# Patient Record
Sex: Female | Born: 1990 | Race: White | Hispanic: No | Marital: Single | Smoking: Former smoker
Health system: Southern US, Community
[De-identification: ages and names within clinical notes are randomized; demographics above are authoritative.]

## PROBLEM LIST (undated history)

## (undated) HISTORY — PX: FOOT SURGERY: SHX648

---

## 2016-04-21 ENCOUNTER — Emergency Department: Payer: Self-pay

## 2016-04-21 ENCOUNTER — Encounter: Payer: Self-pay | Admitting: Emergency Medicine

## 2016-04-21 ENCOUNTER — Emergency Department
Admission: EM | Admit: 2016-04-21 | Discharge: 2016-04-21 | Disposition: A | Discharge status: Home / Self Care | Payer: Self-pay | Attending: Student | Admitting: Student

## 2016-04-21 DIAGNOSIS — R112 Nausea with vomiting, unspecified: Secondary | ICD-10-CM

## 2016-04-21 DIAGNOSIS — R109 Unspecified abdominal pain: Secondary | ICD-10-CM

## 2016-04-21 DIAGNOSIS — Z79899 Other long term (current) drug therapy: Secondary | ICD-10-CM | POA: Insufficient documentation

## 2016-04-21 DIAGNOSIS — R197 Diarrhea, unspecified: Secondary | ICD-10-CM | POA: Insufficient documentation

## 2016-04-21 DIAGNOSIS — O219 Vomiting of pregnancy, unspecified: Secondary | ICD-10-CM | POA: Insufficient documentation

## 2016-04-21 DIAGNOSIS — Z87891 Personal history of nicotine dependence: Secondary | ICD-10-CM | POA: Insufficient documentation

## 2016-04-21 DIAGNOSIS — Z3A01 Less than 8 weeks gestation of pregnancy: Secondary | ICD-10-CM | POA: Insufficient documentation

## 2016-04-21 DIAGNOSIS — O26891 Other specified pregnancy related conditions, first trimester: Secondary | ICD-10-CM | POA: Insufficient documentation

## 2016-04-21 DIAGNOSIS — O26899 Other specified pregnancy related conditions, unspecified trimester: Secondary | ICD-10-CM

## 2016-04-21 DIAGNOSIS — O30019 Twin pregnancy, monochorionic/monoamniotic, unspecified trimester: Secondary | ICD-10-CM

## 2016-04-21 DIAGNOSIS — R1010 Upper abdominal pain, unspecified: Secondary | ICD-10-CM

## 2016-04-21 LAB — CBC
HCT: 38.9 % (ref 35.0–47.0)
HEMATOCRIT: 39.6 % (ref 35.0–47.0)
HEMOGLOBIN: 13.7 g/dL (ref 12.0–16.0)
Hemoglobin: 13.9 g/dL (ref 12.0–16.0)
MCH: 31.2 pg (ref 26.0–34.0)
MCH: 31.7 pg (ref 26.0–34.0)
MCHC: 35.1 g/dL (ref 32.0–36.0)
MCHC: 35 g/dL (ref 32.0–36.0)
MCV: 88.7 fL (ref 80.0–100.0)
MCV: 90.5 fL (ref 80.0–100.0)
PLATELETS: 304 10*3/uL (ref 150–440)
PLATELETS: 283 10*3/uL (ref 150–440)
RBC: 4.38 MIL/uL (ref 3.80–5.20)
RBC: 4.38 MIL/uL (ref 3.80–5.20)
RDW: 12.7 % (ref 11.5–14.5)
RDW: 12.8 % (ref 11.5–14.5)
WBC: 9.6 10*3/uL (ref 3.6–11.0)
WBC: 10.1 10*3/uL (ref 3.6–11.0)

## 2016-04-21 LAB — COMPREHENSIVE METABOLIC PANEL
ALT: 25 U/L (ref 14–54)
ANION GAP: 7 (ref 5–15)
AST: 24 U/L (ref 15–41)
Albumin: 4.5 g/dL (ref 3.5–5.0)
Alkaline Phosphatase: 56 U/L (ref 38–126)
BUN: 7 mg/dL (ref 6–20)
CHLORIDE: 105 mmol/L (ref 101–111)
CO2: 23 mmol/L (ref 22–32)
Calcium: 9 mg/dL (ref 8.9–10.3)
Creatinine, Ser: 0.76 mg/dL (ref 0.44–1.00)
GFR calc non Af Amer: >60 mL/min (ref >60)
Glucose, Bld: 101 mg/dL — ABNORMAL HIGH (ref 65–99)
Potassium: 3.5 mmol/L (ref 3.5–5.1)
SODIUM: 135 mmol/L (ref 135–145)
Total Bilirubin: 0.6 mg/dL (ref 0.3–1.2)
Total Protein: 7.6 g/dL (ref 6.5–8.1)

## 2016-04-21 LAB — URINALYSIS COMPLETE WITH MICROSCOPIC (ARMC ONLY)
BILIRUBIN URINE: NEGATIVE
Glucose, UA: NEGATIVE mg/dL
KETONES UR: NEGATIVE mg/dL
LEUKOCYTES UA: NEGATIVE
Nitrite: NEGATIVE
PH: 7 (ref 5.0–8.0)
PROTEIN: NEGATIVE mg/dL
SPECIFIC GRAVITY, URINE: 1.002 — AB (ref 1.005–1.030)

## 2016-04-21 LAB — LIPASE, BLOOD: Lipase: 18 U/L (ref 11–51)

## 2016-04-21 LAB — HCG, QUANTITATIVE, PREGNANCY: hCG, Beta Chain, Quant, S: 67878 m[IU]/mL — ABNORMAL HIGH (ref <5)

## 2016-04-21 NOTE — ED Provider Notes (Addendum)
[facility] Emergency Department Provider Note   ____________________________________________   First MD Initiated Contact with Patient 04/21/16 [phone]     (approximate)  I have reviewed the triage vital signs and the nursing notes.   HISTORY  Chief Complaint Emesis and Diarrhea    HPI Tracy Jones is a 25 y.o. female G2 P1 at approximately 6 weeks estimated gestational age by last menstrual period who presents for evaluation of 3 days of upper abdominal pain, recurrent nonbloody nonbilious emesis, recurrent diarrhea, gradual onset, constant, moderate, no modifying factors. She denies fevers but she has had chills. She denies any lower abdominal/pelvic pain. No vaginal bleeding or abnormal vaginal discharge. She reports that twice when she had diarrhea she did note some blood mixed with the stool. No hematemesis, no dark or tarry stools. 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 She has not established care with an OB/GYN yet this pregnancy.   History reviewed. No pertinent past medical history.  There are no active problems to display for this patient.   Past Surgical History:  Procedure Laterality Date  . CESAREAN SECTION    . FOOT SURGERY      Prior to Admission medications   Medication Sig Start Date End Date Taking? Authorizing Provider  Prenatal Vit-Fe Fumarate-FA (PRENATAL MULTIVITAMIN) TABS tablet Take 1 tablet by mouth at bedtime.    Yes Historical Provider, MD    Allergies Codeine  History reviewed. No pertinent family history.  Social History Social History  Substance Use Topics  . Smoking status: Former Games developer  . Smokeless tobacco: Never Used  . Alcohol use No    Review of Systems Constitutional: No fever, +chills Eyes: No visual changes. ENT: No sore throat. Cardiovascular: Denies chest pain. Respiratory: Denies shortness of breath. Gastrointestinal: + abdominal pain.  +  nausea, + vomiting.  + diarrhea.  No constipation. Genitourinary: Negative for dysuria. Musculoskeletal: Negative for back pain. Skin: Negative for rash. Neurological: Negative for headaches, focal weakness or numbness.  10-point ROS otherwise negative.  ____________________________________________   PHYSICAL EXAM:  Vitals:   04/21/16 0731 04/21/16 0830 04/21/16 0900 04/21/16 0930  BP: 118/71 117/61 115/67 123/73  Pulse: 77 65 79 74  Resp: 18     Temp:      TempSrc:      SpO2: 100% 100% 100% 97%  Weight:      Height:        VITAL SIGNS: ED Triage Vitals  Enc Vitals Group     BP 04/21/16 0229 135/76     Pulse Rate 04/21/16 0229 97     Resp 04/21/16 0229 16     Temp 04/21/16 0229 98.5 F (36.9 C)     Temp Source 04/21/16 0229 Oral     SpO2 04/21/16 0229 100 %     Weight 04/21/16 0229 184 lb (83.5 kg)     Height 04/21/16 0229 5\' 4"  (1.626 m)     Head Circumference --      Peak Flow --      Pain Score 04/21/16 0230 4     Pain Loc --      Pain Edu? --      Excl. in GC? --     Constitutional: Alert and oriented. Well appearing and in no acute distress. Eyes: Conjunctivae are normal. PERRL. EOMI. Head: Atraumatic. Nose: No congestion/rhinnorhea. Mouth/Throat: Mucous membranes are moist.  Oropharynx non-erythematous. Neck: No stridor. Supple without meningismus. Cardiovascular: Normal rate, regular  rhythm. Grossly normal heart sounds.  Good peripheral circulation. Respiratory: Normal respiratory effort.  No retractions. Lungs CTAB. Gastrointestinal: Soft and nontender. No distention. No CVA tenderness. Genitourinary: Deferred Rectal: small external hemorrhoid visualized, brown stool in the rectal vault is trace guaiac positive Musculoskeletal: No lower extremity tenderness nor edema.  No joint effusions. Neurologic:  Normal speech and language. No gross focal neurologic deficits are appreciated. No gait instability. Skin:  Skin is warm, dry and intact. No rash  noted. Psychiatric: Mood and affect are normal. Speech and behavior are normal.  ____________________________________________   LABS (all labs ordered are listed, but only abnormal results are displayed)  Labs Reviewed  COMPREHENSIVE METABOLIC PANEL - Abnormal; Notable for the following:       Result Value   Glucose, Bld 101 (*)    All other components within normal limits  URINALYSIS COMPLETEWITH MICROSCOPIC ([facility] ONLY) - Abnormal; Notable for the following:    Color, Urine STRAW (*)    APPearance CLEAR (*)    Specific Gravity, Urine 1.002 (*)    Hgb urine dipstick 2+ (*)    Bacteria, UA RARE (*)    Squamous Epithelial / LPF 0-5 (*)    All other components within normal limits  HCG, QUANTITATIVE, PREGNANCY - Abnormal; Notable for the following:    hCG, Beta Chain, Mahalia Longest 40,981 (*)    All other components within normal limits  CBC  LIPASE, BLOOD  CBC   ____________________________________________  EKG  none ____________________________________________  RADIOLOGY  Transvaginal ultrasound IMPRESSION: 1. Living twin gestations. No complications identified. 2. The estimated gestational ages are 5 weeks and 6 days. 3. Small amount of free fluid within the pelvis. This may be physiologic. ____________________________________________   PROCEDURES  Procedure(s) performed: None  Procedures  Critical Care performed: No  ____________________________________________   INITIAL IMPRESSION / ASSESSMENT AND PLAN / ED COURSE  Pertinent labs & imaging results that were available during my care of the patient were reviewed by me and considered in my medical decision making (see chart for details).  Tracy Jones is a 25 y.o. female G2 P1 at approximately 6 weeks estimated gestational age by last menstrual period who presents for evaluation of 3 days of upper abdominal pain, recurrent nonbloody nonbilious emesis, recurrent diarrhea. On exam, she is very  well-appearing and in no acute distress. Vital signs stable, she is afebrile. She has benign abdominal exam without rebound, guarding, no rigidity, normal bowel sounds. She does have a small external hemorrhoid which I suspect is the likely cause of her noticing blood in her stools. There is no gross blood in the rectal vault, brown stool only which is faintly guaiac positive. Suspect viral syndrome given recent sick contacts. CMP is generally unremarkable as is CBC, normal lipase. Urinalysis is not consistent with infection. HCG was elevated as expected in early pregnancy. We'll obtain transvaginal ultrasound to rule out ectopic. She has refused any antiemetics at this time stating that she is concerned about adverse events and pregnancy. Her initial hemoglobin was 13.7 which improved to 13.9 when rechecked greater than 5 hours later, there is no evidence to suggest a clinically significant GI bleed.  ----------------------------------------- 11:43 AM on 04/21/2016 ----------------------------------------- Transvaginal ultrasound shows live intrauterine twin gestation. Patient has not had anymore diarrhea, no blood from her bottom since arrival, she has had no vomiting since arrival to the emergency department and she is tolerating by mouth intake. I discussed meticulous return precautions, need for close OB/GYN follow-up and she is  comfortable with the discharge plan. DC home. She has diclegis at home.   Clinical Course     ____________________________________________   FINAL CLINICAL IMPRESSION(S) / ED DIAGNOSES  Final diagnoses:  Nausea vomiting and diarrhea  Pain of upper abdomen  Abdominal pain in pregnancy      NEW MEDICATIONS STARTED DURING THIS VISIT:  New Prescriptions   No medications on file     Note:  This document was prepared using Dragon voice recognition software and may include unintentional dictation errors.    Gayla Doss, MD 04/21/16 1144    Gayla Doss, MD 04/21/16 [phone]

## 2016-04-21 NOTE — ED Notes (Signed)
Pt reports 3 days n/v and diarrhea. Reports being [redacted] weeks pregnant and states that she has experienced morning sickness in the past but not like this. She states that she is experiencing bright red blood per rectum and denies history of hemmorroids. NAD at this time, respirations equal and unlabored, skin warm and dry.

## 2016-04-21 NOTE — ED Notes (Signed)
Pt given cup of water at this time. NAD noted. Denies other needs. SO at bedside at this time. Will continue to monitor for further patient needs.

## 2016-04-21 NOTE — ED Notes (Signed)
Discussed zofran offer for pt in triage. Pt declines offer for zofran. Pt states "i'm not totally comfortable with that" in reference to pregnancy.

## 2016-04-21 NOTE — ED Notes (Signed)
Pt resting in bed at this time will continue to monitor. Pt requests water, explained to patient that per MD [place] results needed prior to her getting water. Pt states understanding. Pt repositioned in bed at this time, lights turned down. Pt's SO at bedside.

## 2016-04-21 NOTE — ED Triage Notes (Signed)
Pt states three days of nausea, vomiting, diarrhea and chills. Pt complains of lower and mid "abdominal" pressure.

## 2016-04-21 NOTE — ED Notes (Signed)
NAD noted at time of D/C. Pt denies questions or concerns. Pt ambulatory to the lobby at this time.  

## 2016-04-21 NOTE — ED Notes (Signed)
NAD Noted at this time. Delay explained to patient at this time. Pt states understanding. SO remains at bedside at this time. Pt denies needs at this time. Will continue to monitor.

## 2017-03-04 IMAGING — US US OB EACH ADDL GEST<[ID]
1 series · 14 of 28 positions shown · non-contrast
Comparison: None.

CLINICAL DATA: Pain for 1 week.  Twin gestation.

EXAM:
TWIN OBSTETRIC <14WK US AND TRANSVAGINAL OB US

[Series 1: us ob each addl gest<(id) · 0.15mm/px · 108 acquisitions, 14 frames shown]
[im 4/108]
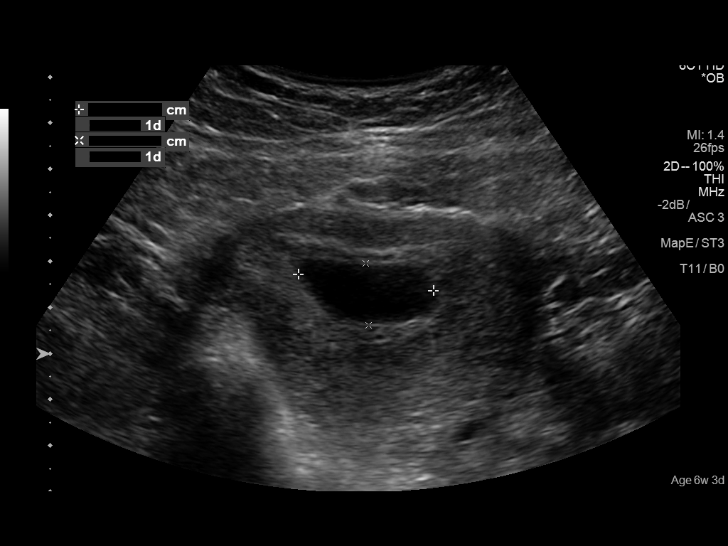
[im 12/108]
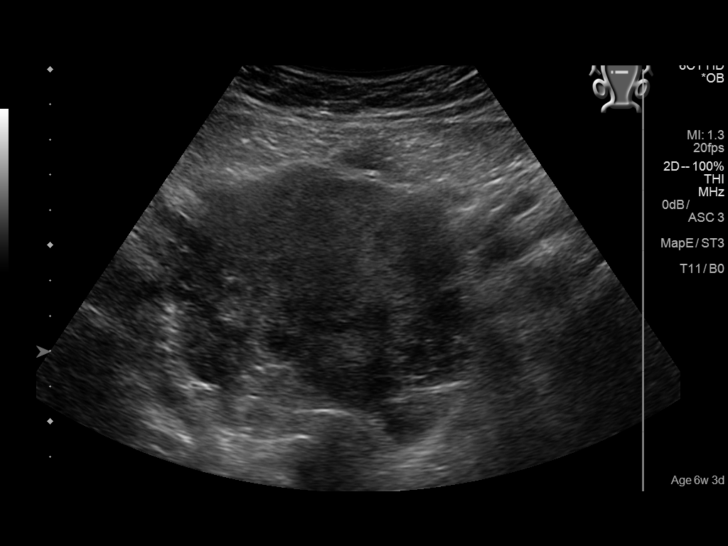
[im 20/108]
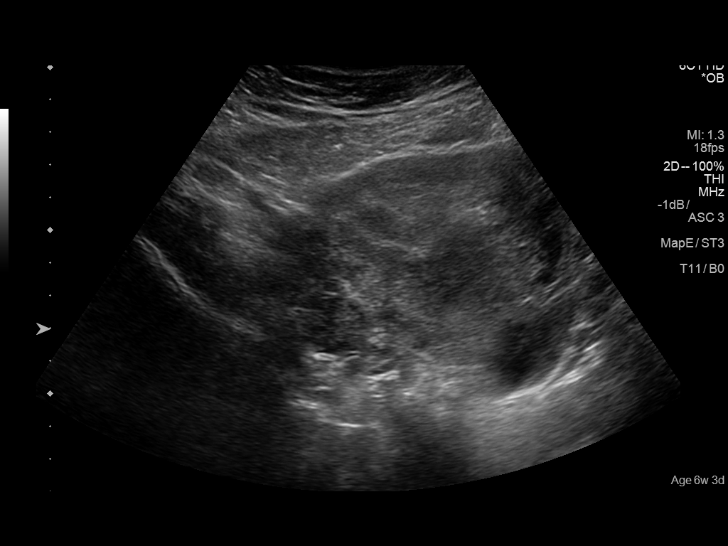
[im 28/108]
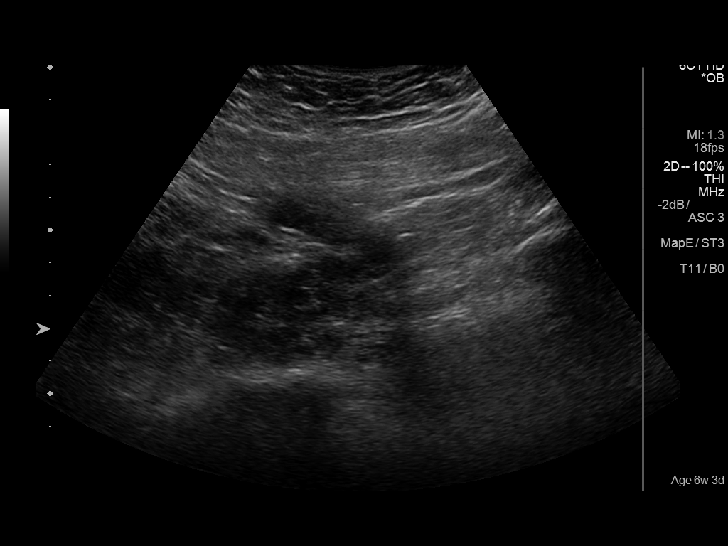
[im 36/108]
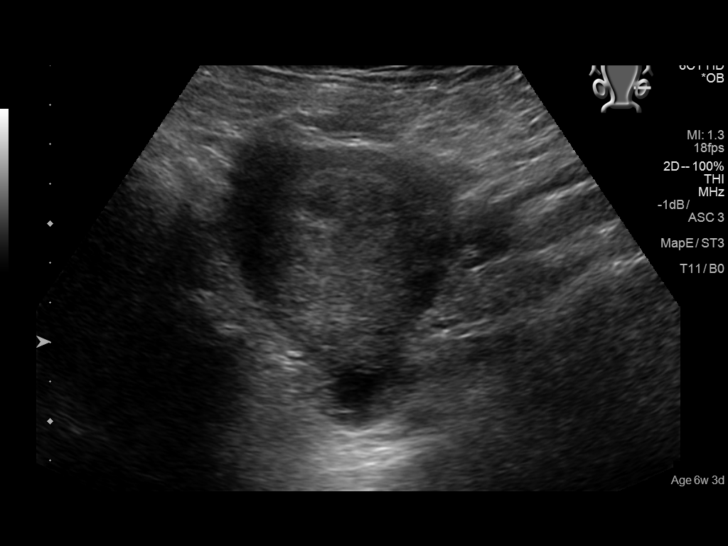
[im 44/108]
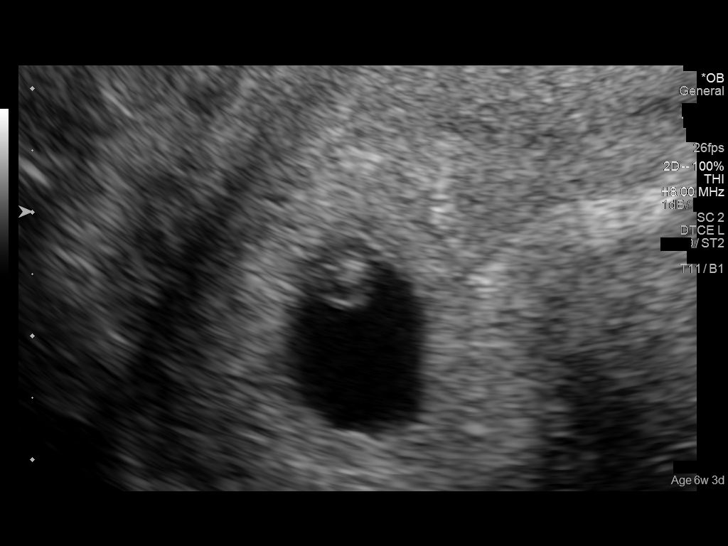
[im 52/108]
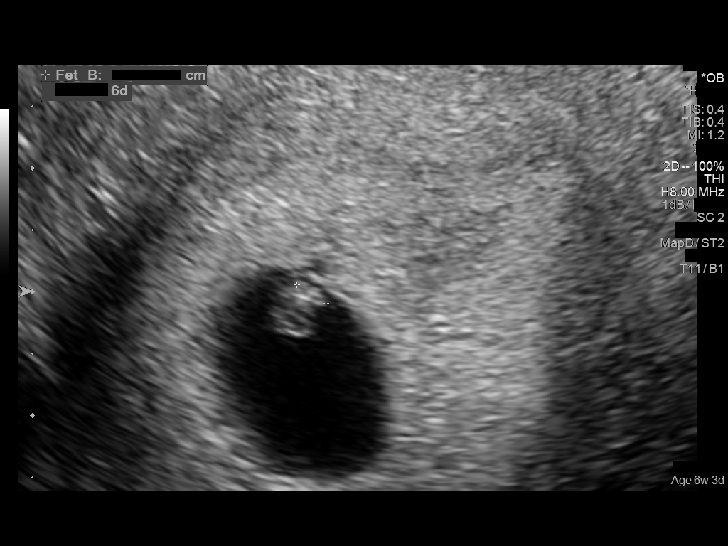
[im 60/108]
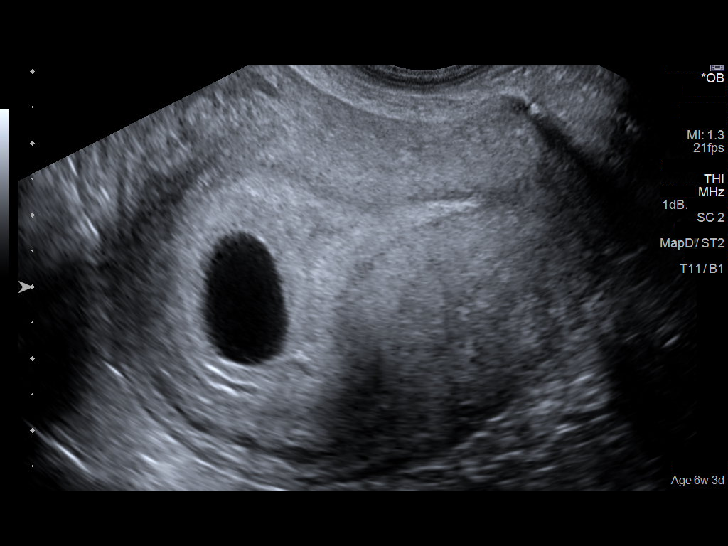
[im 68/108]
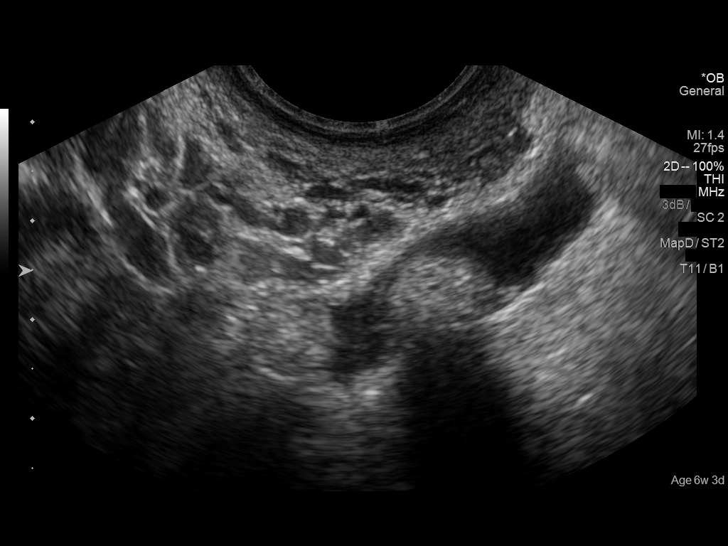
[im 76/108]
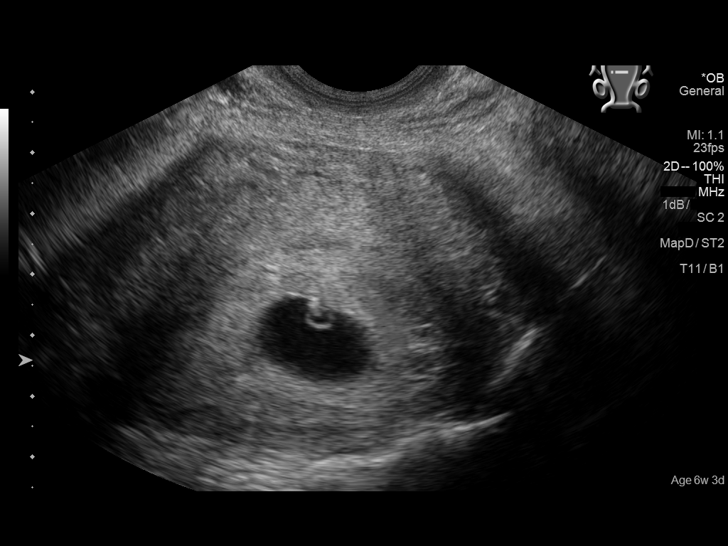
[im 84/108]
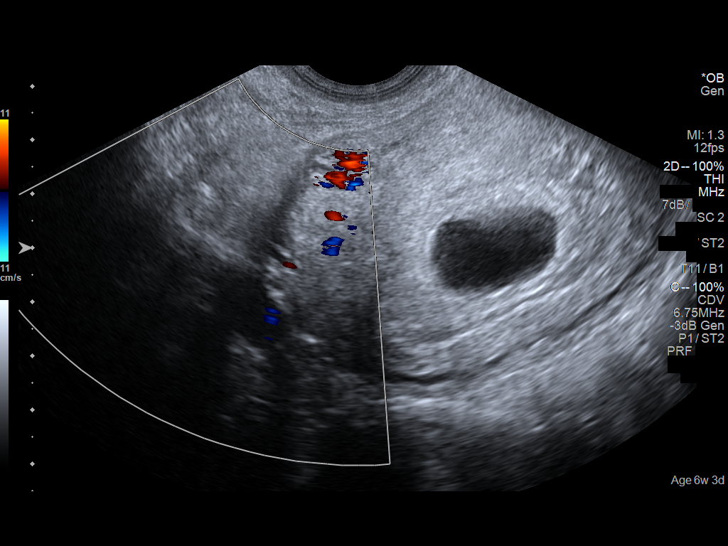
[im 92/108]
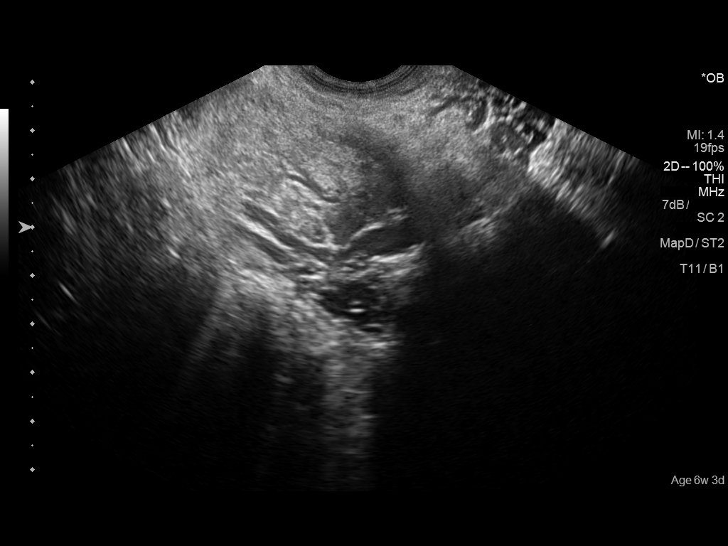
[im 100/108]
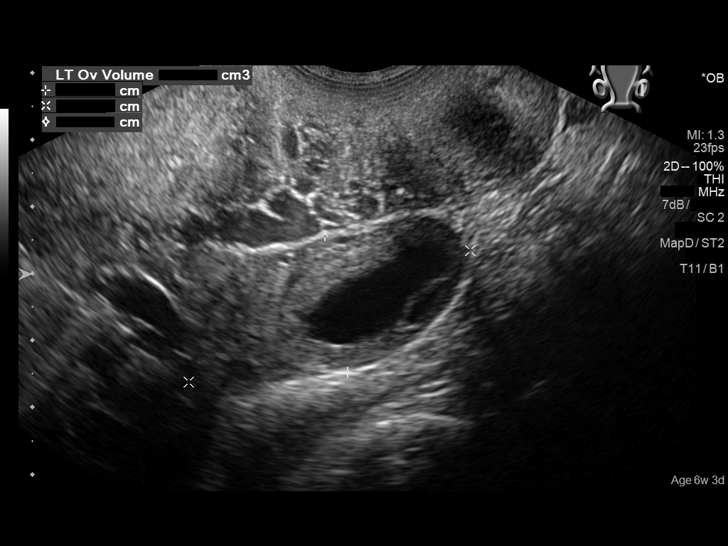
[im 108/108]
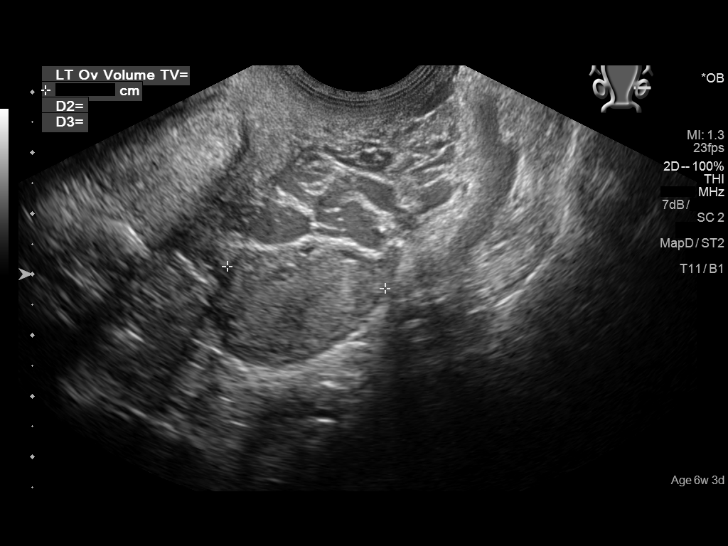

[14 of 28 positions shown; findings below may reference images not displayed]

FINDINGS: Number of IUPs:  2

Chorionicity/Amnionicity:  Monochorionic monoamniotic

TWIN 1

Yolk sac:  Yes

Embryo:  Yes

Cardiac Activity: Yes

Heart Rate: 112 bpm

CRL:  2.5  mm   5 w 6 d                  US EDC: 12/16/2016

TWIN 2

Yolk sac:  Yes

Embryo:  Yes

Cardiac Activity: Yes

Heart Rate: 107 bpm

CRL:  2.8  mm   5 w 6 d                  US EDC: 12/16/2016

Maternal uterus/adnexae:

Subchorionic hemorrhage: None

Right ovary: Normal

Left ovary: Normal

Other :None

Free fluid:  Small amount of free fluid noted.
IMPRESSION: 1. Living twin gestations.  No complications identified.
2. The estimated gestational ages are 5 weeks and 6 days.
3. Small amount of free fluid within the pelvis. This may be
physiologic.
